# Patient Record
Sex: Male | Born: 1990 | Race: Black or African American | Hispanic: No | Marital: Single | Smoking: Current some day smoker
Health system: Southern US, Community
[De-identification: ages and names within clinical notes are randomized; demographics above are authoritative.]

## PROBLEM LIST (undated history)

## (undated) DIAGNOSIS — J942 Hemothorax: Secondary | ICD-10-CM

## (undated) DIAGNOSIS — S21332A Puncture wound without foreign body of left front wall of thorax with penetration into thoracic cavity, initial encounter: Secondary | ICD-10-CM

## (undated) DIAGNOSIS — W3400XA Accidental discharge from unspecified firearms or gun, initial encounter: Secondary | ICD-10-CM

---

## 2017-10-01 ENCOUNTER — Inpatient Hospital Stay (HOSPITAL_COMMUNITY)
Admission: EM | Admit: 2017-10-01 | Discharge: 2017-10-04 | DRG: 200 | Disposition: A | Discharge status: Home / Self Care | Payer: Self-pay | Attending: Thoracic Surgery (Cardiothoracic Vascular Surgery) | Admitting: Thoracic Surgery (Cardiothoracic Vascular Surgery)

## 2017-10-01 ENCOUNTER — Other Ambulatory Visit: Payer: Self-pay

## 2017-10-01 ENCOUNTER — Encounter (HOSPITAL_COMMUNITY): Payer: Self-pay | Admitting: Emergency Medicine

## 2017-10-01 ENCOUNTER — Inpatient Hospital Stay (HOSPITAL_COMMUNITY): Payer: Self-pay

## 2017-10-01 ENCOUNTER — Emergency Department (HOSPITAL_COMMUNITY): Payer: Self-pay

## 2017-10-01 DIAGNOSIS — W3400XA Accidental discharge from unspecified firearms or gun, initial encounter: Secondary | ICD-10-CM

## 2017-10-01 DIAGNOSIS — S270XXD Traumatic pneumothorax, subsequent encounter: Secondary | ICD-10-CM

## 2017-10-01 DIAGNOSIS — J9311 Primary spontaneous pneumothorax: Secondary | ICD-10-CM

## 2017-10-01 DIAGNOSIS — S21332A Puncture wound without foreign body of left front wall of thorax with penetration into thoracic cavity, initial encounter: Secondary | ICD-10-CM

## 2017-10-01 DIAGNOSIS — Z938 Other artificial opening status: Secondary | ICD-10-CM

## 2017-10-01 DIAGNOSIS — Z9689 Presence of other specified functional implants: Secondary | ICD-10-CM

## 2017-10-01 DIAGNOSIS — J942 Hemothorax: Secondary | ICD-10-CM | POA: Diagnosis present

## 2017-10-01 DIAGNOSIS — J939 Pneumothorax, unspecified: Secondary | ICD-10-CM

## 2017-10-01 DIAGNOSIS — S272XXA Traumatic hemopneumothorax, initial encounter: Principal | ICD-10-CM | POA: Diagnosis present

## 2017-10-01 DIAGNOSIS — F17210 Nicotine dependence, cigarettes, uncomplicated: Secondary | ICD-10-CM | POA: Diagnosis present

## 2017-10-01 DIAGNOSIS — S21142A Puncture wound with foreign body of left front wall of thorax without penetration into thoracic cavity, initial encounter: Secondary | ICD-10-CM | POA: Diagnosis present

## 2017-10-01 DIAGNOSIS — Z4682 Encounter for fitting and adjustment of non-vascular catheter: Secondary | ICD-10-CM

## 2017-10-01 DIAGNOSIS — F1290 Cannabis use, unspecified, uncomplicated: Secondary | ICD-10-CM | POA: Diagnosis present

## 2017-10-01 HISTORY — DX: Hemothorax: J94.2

## 2017-10-01 HISTORY — DX: Accidental discharge from unspecified firearms or gun, initial encounter: W34.00XA

## 2017-10-01 HISTORY — DX: Puncture wound without foreign body of left front wall of thorax with penetration into thoracic cavity, initial encounter: S21.332A

## 2017-10-01 LAB — MRSA PCR SCREENING: MRSA by PCR: NEGATIVE

## 2017-10-01 LAB — GLUCOSE, CAPILLARY
GLUCOSE-CAPILLARY: 145 mg/dL — AB (ref 65–99)
GLUCOSE-CAPILLARY: 110 mg/dL — AB (ref 65–99)

## 2017-10-01 MED ORDER — MORPHINE SULFATE (PF) 4 MG/ML IV SOLN
2.0000 mg | Freq: Once | INTRAVENOUS | Status: AC
  Administered 2017-10-01: 2 mg via INTRAVENOUS
  Filled 2017-10-01: qty 1

## 2017-10-01 MED ORDER — ONDANSETRON HCL 4 MG/2ML IJ SOLN
4.0000 mg | Freq: Four times a day (QID) | INTRAMUSCULAR | Status: DC | PRN
  Administered 2017-10-03: 4 mg via INTRAVENOUS
  Filled 2017-10-01 (×2): qty 2

## 2017-10-01 MED ORDER — ENSURE ENLIVE PO LIQD
237.0000 mL | Freq: Two times a day (BID) | ORAL | Status: DC
  Administered 2017-10-02 – 2017-10-04 (×6): 237 mL via ORAL

## 2017-10-01 MED ORDER — ACETAMINOPHEN 500 MG PO TABS
1000.0000 mg | ORAL_TABLET | Freq: Four times a day (QID) | ORAL | Status: DC
  Administered 2017-10-01 – 2017-10-03 (×6): 1000 mg via ORAL
  Filled 2017-10-01 (×7): qty 2

## 2017-10-01 MED ORDER — SODIUM CHLORIDE 0.9 % IV SOLN
INTRAVENOUS | Status: DC
  Administered 2017-10-01: 17:00:00 via INTRAVENOUS

## 2017-10-01 MED ORDER — TRAMADOL HCL 50 MG PO TABS
50.0000 mg | ORAL_TABLET | Freq: Four times a day (QID) | ORAL | Status: DC | PRN
  Administered 2017-10-03: 100 mg via ORAL
  Filled 2017-10-01: qty 2

## 2017-10-01 MED ORDER — LIDOCAINE HCL (PF) 1 % IJ SOLN
INTRAMUSCULAR | Status: AC
  Filled 2017-10-01: qty 30

## 2017-10-01 MED ORDER — MIDAZOLAM HCL 2 MG/2ML IJ SOLN
2.0000 mg | Freq: Once | INTRAMUSCULAR | Status: AC
  Administered 2017-10-01: 2 mg via INTRAVENOUS
  Filled 2017-10-01: qty 2

## 2017-10-01 MED ORDER — OXYCODONE HCL 5 MG PO TABS
5.0000 mg | ORAL_TABLET | ORAL | Status: DC | PRN
  Administered 2017-10-01 – 2017-10-02 (×2): 10 mg via ORAL
  Administered 2017-10-02: 5 mg via ORAL
  Administered 2017-10-02 – 2017-10-03 (×3): 10 mg via ORAL
  Filled 2017-10-01 (×2): qty 2
  Filled 2017-10-01: qty 1
  Filled 2017-10-01 (×3): qty 2

## 2017-10-01 MED ORDER — SENNOSIDES-DOCUSATE SODIUM 8.6-50 MG PO TABS
1.0000 | ORAL_TABLET | Freq: Every day | ORAL | Status: DC
  Administered 2017-10-02: 1 via ORAL
  Filled 2017-10-01 (×2): qty 1

## 2017-10-01 MED ORDER — POTASSIUM CHLORIDE 10 MEQ/50ML IV SOLN: 10.0000 meq | Freq: Every day | INTRAVENOUS | Status: DC | PRN

## 2017-10-01 MED ORDER — ENOXAPARIN SODIUM 40 MG/0.4ML ~~LOC~~ SOLN: 40.0000 mg | Freq: Every day | SUBCUTANEOUS | Status: DC

## 2017-10-01 MED ORDER — BISACODYL 5 MG PO TBEC
10.0000 mg | DELAYED_RELEASE_TABLET | Freq: Every day | ORAL | Status: DC
  Filled 2017-10-01 (×2): qty 2

## 2017-10-01 MED ORDER — LIDOCAINE-EPINEPHRINE (PF) 2 %-1:200000 IJ SOLN: 10.0000 mL | Freq: Once | INTRAMUSCULAR | Status: DC

## 2017-10-01 MED ORDER — ACETAMINOPHEN 160 MG/5ML PO SOLN: 1000.0000 mg | Freq: Four times a day (QID) | ORAL | Status: DC
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
## 2017-10-01 NOTE — ED Notes (Signed)
Patient transported to X-ray 

## 2017-10-01 NOTE — Op Note (Signed)
CARDIOTHORACIC SURGERY OPERATIVE NOTE  Date of Procedure:  10/01/2017  Preoperative Diagnosis: Left Pneumothorax  Postoperative Diagnosis: Same  Procedure:   Left chest tube placement  Surgeon:   Salvatore Decentlarence H. Cornelius Moraswen, MD  Anesthesia: 1% lidocaine local with intravenous sedation    DETAILS OF THE OPERATIVE PROCEDURE  Following full informed consent the patient was given midazolam 2 mg and morphine sulfate 2 mg intravenously and continuously monitored for rhythm, BP and oxygen saturation. The left chest was prepared and draped in a sterile manner. 1% lidocaine was utilized to anesthetize the skin and subcutaneous tissues. A small incision was made and a 28 French straight chest tube was placed through the incision into the pleural space. The tube was secured to the skin and connected to a closed suction collection device. The patient tolerated the procedure well. A portable CXR was ordered. There were no complications.    Salvatore Decentlarence H. Cornelius Moraswen, MD 10/01/2017 5:39 PM

## 2017-10-02 ENCOUNTER — Inpatient Hospital Stay (HOSPITAL_COMMUNITY): Payer: Self-pay

## 2017-10-02 DIAGNOSIS — Z938 Other artificial opening status: Secondary | ICD-10-CM

## 2017-10-02 LAB — BASIC METABOLIC PANEL
Anion gap: 9 (ref 5–15)
BUN: 18 mg/dL (ref 6–20)
CALCIUM: 8.7 mg/dL — AB (ref 8.9–10.3)
CO2: 23 mmol/L (ref 22–32)
Chloride: 106 mmol/L (ref 101–111)
Creatinine, Ser: 1.02 mg/dL (ref 0.61–1.24)
GFR calc Af Amer: >60 mL/min (ref >60)
GLUCOSE: 89 mg/dL (ref 65–99)
Potassium: 3.5 mmol/L (ref 3.5–5.1)
SODIUM: 138 mmol/L (ref 135–145)

## 2017-10-02 LAB — CBC
HCT: 40.2 % (ref 39.0–52.0)
Hemoglobin: 12.9 g/dL — ABNORMAL LOW (ref 13.0–17.0)
MCH: 27 pg (ref 26.0–34.0)
MCHC: 32.1 g/dL (ref 30.0–36.0)
MCV: 84.1 fL (ref 78.0–100.0)
PLATELETS: 246 10*3/uL (ref 150–400)
RBC: 4.78 MIL/uL (ref 4.22–5.81)
RDW: 14.5 % (ref 11.5–15.5)
WBC: 5.7 10*3/uL (ref 4.0–10.5)

## 2017-10-02 LAB — GLUCOSE, CAPILLARY
GLUCOSE-CAPILLARY: 102 mg/dL — AB (ref 65–99)
GLUCOSE-CAPILLARY: 86 mg/dL (ref 65–99)
Glucose-Capillary: 94 mg/dL (ref 65–99)

## 2017-10-02 NOTE — Progress Notes (Signed)
Pt. educated on pain medications and the importance of only taking the lowest strength that is needed. Pt. also educated on the importance of ambulating several times a day. Pt. states he does not want to ambulate right now because he did not get a lot of sleep last night and just wants to sleep. Pt. states he will be willing to walk around 1500.

## 2017-10-02 NOTE — Progress Notes (Addendum)
      [address]       [place] [id number]             [phone]    Subjective:  No new complaints.  Some pain at chest tube site.    Objective: Vital signs in last 24 hours: Temp:  [97.5 F (36.4 C)-98.6 F (37 C)] 98 F (36.7 C) (02/24 1020) Pulse Rate:  [62-105] 62 (02/24 0530) Cardiac Rhythm: Normal sinus rhythm (02/24 1020) Resp:  [16-25] 18 (02/23 1850) BP: (110-134)/(63-94) 122/67 (02/24 1020) SpO2:  [97 %-100 %] 100 % (02/24 1020) Weight:  [177 lb 14.6 oz (80.7 kg)] 177 lb 14.6 oz (80.7 kg) (02/23 2104)  Intake/Output from previous day: 02/23 0701 - 02/24 0700 In: 480 [P.O.:480] Out: 229 [Urine:175; Chest Tube:54] Intake/Output this shift: No intake/output data recorded.  General appearance: alert, cooperative and no distress Heart: regular rate and rhythm Lungs: clear to auscultation bilaterally Abdomen: soft, non-tender; bowel sounds normal; no masses,  no organomegaly Extremities: extremities normal, atraumatic, no cyanosis or edema Wound: clean and dry  Lab Results: Recent Labs    10/02/17 0152  WBC 5.7  HGB 12.9*  HCT 40.2  PLT 246   BMET:  Recent Labs    10/02/17 0152  NA 138  K 3.5  CL 106  CO2 23  GLUCOSE 89  BUN 18  CREATININE 1.02  CALCIUM 8.7*    PT/INR: No results for input(s): LABPROT, INR in the last 72 hours. ABG No results found for: PHART, HCO3, TCO2, ACIDBASEDEF, O2SAT CBG (last 3)  Recent Labs    10/02/17 0116 10/02/17 0314 10/02/17 0525  GLUCAP 102* 94 86    Assessment/Plan:  1. Chest tube-no air leak, chest tube was supposed to be on suction, but was never connected-- CXR show left apical pneumothorax 2. Pain control- will continue oral use 3. Dispo- patient will be transferred to 4E, will place chest tube to suction as ordered, repeat CXR in AM   LOS: 1 day    Erin Barrett 10/02/2017  I have seen and examined the patient and agree with the assessment and plan as outlined.  Chest tube  was never hooked up to suction.  Will need to transfer patient to a floor where nursing staff is accustomed to caring for patients with chest tubes and executing orders as they are instructed.  Purcell Nailslarence H Ariauna Farabee, MD 10/02/2017 8:45 PM
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
## 2017-10-02 NOTE — Plan of Care (Signed)
Pt and visitor were updated and educated on pts plan of care. Pt expressed frustration over not being told earlier in the day what he needed to do promote healing. Writer educated pt the importance of staying active throughout the day, keeping HOB at 45 degrees, dangling legs on the side of the bed 2-3 times a day, ambulating 2-3 times a day. Pt states he did not feel like walking much today because he did not sleep well last night. Writer explained that sleep is also important, and provided encouragement so that he is now aware of what his plan of care is and how the rest of the shift will look tonight and what he needs to do to prepare for discharge.

## 2017-10-03 ENCOUNTER — Inpatient Hospital Stay (HOSPITAL_COMMUNITY): Payer: Self-pay

## 2017-10-03 MED ORDER — ACETAMINOPHEN 160 MG/5ML PO SOLN: 1000.0000 mg | Freq: Four times a day (QID) | ORAL | Status: DC

## 2017-10-03 MED ORDER — ACETAMINOPHEN 500 MG PO TABS
1000.0000 mg | ORAL_TABLET | Freq: Four times a day (QID) | ORAL | Status: DC
  Administered 2017-10-03 – 2017-10-04 (×4): 1000 mg via ORAL
  Filled 2017-10-03 (×3): qty 2

## 2017-10-03 NOTE — Progress Notes (Signed)
Patient arrived on 4E on a wheelchair, assessment completed see flowsheet, placed on tele ccmd notified, patient oriented to room and staff, bed in lowest position, call bell in reach will continue to monitor.

## 2017-10-03 NOTE — Progress Notes (Addendum)
      [address]       [place] [id number]             [phone]           Subjective: Patient sitting in chair. Has pain at chest tube site.  Objective: Vital signs in last 24 hours: Temp:  [98.2 F (36.8 C)-98.5 F (36.9 C)] 98.5 F (36.9 C) (02/25 1000) Pulse Rate:  [69-75] 69 (02/25 0629) Cardiac Rhythm: Normal sinus rhythm (02/25 0800) Resp:  [18] 18 (02/25 0629) BP: (118-124)/(67-72) 124/72 (02/25 0629) SpO2:  [100 %] 100 % (02/25 0629)      Intake/Output from previous day: 02/24 0701 - 02/25 0700 In: -  Out: 1150 [Urine:975; Chest Tube:175]   Physical Exam:  Cardiovascular: RRR Pulmonary: Clear to auscultation bilaterally Extremities: No lower extremity edema. Wounds: Dressing is clean and dry.   Chest Tube: to water seal, no air leak  Lab Results: CBC: Recent Labs    10/02/17 0152  WBC 5.7  HGB 12.9*  HCT 40.2  PLT 246   BMET:  Recent Labs    10/02/17 0152  NA 138  K 3.5  CL 106  CO2 23  GLUCOSE 89  BUN 18  CREATININE 1.02  CALCIUM 8.7*    PT/INR: No results for input(s): LABPROT, INR in the last 72 hours. ABG:  INR: Will add last result for INR, ABG once components are confirmed Will add last 4 CBG results once components are confirmed  Assessment/Plan:  1. CV - SR. 2.  Pulmonary - On room air. Chest tube with 175 cc of output last 24 hours. Chest tube is to water seal. There is no air leak. CXR this am is stable (showed small left pneumothorax and small effusion). Encourage incentive spirometer. Hope to remove chest tube in am. Check CXR in am.   Lelon HuhDonielle M Peak One Surgery CenterZimmermanPA-C 10/03/2017,11:03 AM   I have seen and examined the patient and agree with the assessment and plan as outlined.  D/C tube tomorrow if CXR stable and no air leak.   I spent in excess of 15 minutes during the conduct of this hospital encounter and >50% of this time involved direct face-to-face encounter with the patient for counseling and/or  coordination of their care.   Purcell Nailslarence H Arleen Bar, MD 10/03/2017

## 2017-10-03 NOTE — Progress Notes (Signed)
Arrival of shift, pt complained of pain 7/10. Patient requested for oxycodone instead of tramadol. About 10:30 pm, pt had vomited twice. Pt was assessed. VSS. Gf was at the bedside getting upset and accusing of "too many pills being forced into pt". Pt was explained PRN zofran for nausea. Pt requested zofran and is resting. Will continue to monitor.   Judithann SheenJuhi Antonae Zbikowski., RN

## 2017-10-03 NOTE — Progress Notes (Signed)
Patient ambulated [id number] in the hall. Ambulation well tolerated.

## 2017-10-03 NOTE — Progress Notes (Signed)
Patient refused to ambulate in the hall, he stated that he would walk later, patient educated on the need to ambulate he verbalised understanding, will continue to monitor and report to oncoming nurse.

## 2017-10-03 NOTE — Care Management Note (Signed)
Case Management Note  Patient Details  Name: Stanley Stevenson MRN: [id number] Date of Birth: 08-18-1990  Subjective/Objective:   From home, pta indep, he presents with ptx, has chest tube in today, Patient has follow up apt at [facility] for 3/19 at 1:30.   He also can utilize the [facility] for medication assit if dc Mon thru Friday.  If dc over the weekend he will need assist with Match letter for medications. Also financial counselor will see patient about medicaid.                 Action/Plan: NCM will follow for transition of care.  Expected Discharge Date:                  Expected Discharge Plan:  Home/Self Care  In-House Referral:     Discharge planning Services  CM Consult, Medication Assistance, Follow-up appt scheduled, [facility]  Post Acute Care Choice:    Choice offered to:     DME Arranged:    DME Agency:     HH Arranged:    HH Agency:     Status of Service:  In process, will continue to follow  If discussed at Long Length of Stay Meetings, dates discussed:    Additional Comments:  Stanley Stevenson, Stanley Stevenson [place], RN 10/03/2017, 11:09 AM

## 2017-10-03 NOTE — Progress Notes (Signed)
Nutrition Brief Note  Patient identified on the Malnutrition Screening Tool (MST) Report.  Wt Readings from Last 15 Encounters:  10/01/17 177 lb 14.6 oz (80.7 kg)   Body mass index is 25.53 kg/m. Patient meets criteria for Overweight based on current BMI.   Current diet order is Regular. Reports an excellent appetite. Labs and medications reviewed.   Nutrition focused physical exam completed.  No muscle or subcutaneous fat depletion noticed.  No nutrition interventions warranted at this time. If nutrition issues arise, please consult RD.   Maureen ChattersKatie Justun Anaya, RD, LDN Pager #: [phone] After-Hours Pager #: [phone]

## 2017-10-04 ENCOUNTER — Inpatient Hospital Stay (HOSPITAL_COMMUNITY): Payer: Self-pay

## 2017-10-04 MED ORDER — IBUPROFEN 200 MG PO TABS: 200.0000 mg | ORAL_TABLET | Freq: Four times a day (QID) | ORAL | Status: AC | PRN

## 2017-10-04 NOTE — Progress Notes (Signed)
Chest tube L anterior chest removed w/o difficulty per order.  Scant clear/red drainage noted at site.  Dry dressing applied.  Nurses x 2 present. Eugenio HoesMichelle Mikah Poss RN

## 2017-10-04 NOTE — Progress Notes (Signed)
Patient in a stable condition, discharge eduction reviewed with patient and his girlfriend at bedside, they verbalized understanding, tele dc ccmd notified, patient belongings at bedside, patient awaiting his family to transport him home.

## 2017-10-04 NOTE — Progress Notes (Signed)
      [address]       [place] [id number]             [phone]      Subjective:  Mr. Stanley Stevenson has no complaints.  He wants to go home today.  Objective: Vital signs in last 24 hours: Temp:  [98.1 F (36.7 C)-98.7 F (37.1 C)] 98.5 F (36.9 C) (02/26 0730) Pulse Rate:  [67-97] 89 (02/26 0730) Cardiac Rhythm: Sinus tachycardia (02/26 0700) Resp:  [11-31] 23 (02/26 0730) BP: (127-139)/(74-86) 127/77 (02/26 0730) SpO2:  [98 %-100 %] 100 % (02/26 0730)  Intake/Output from previous day: 02/25 0701 - 02/26 0700 In: 315 [P.O.:240] Out: 50 [Chest Tube:50]  General appearance: alert, cooperative and no distress Heart: regular rate and rhythm Lungs: clear to auscultation bilaterally Abdomen: soft, non-tender; bowel sounds normal; no masses,  no organomegaly Extremities: extremities normal, atraumatic, no cyanosis or edema Wound: clean and dry  Lab Results: Recent Labs    10/02/17 0152  WBC 5.7  HGB 12.9*  HCT 40.2  PLT 246   BMET:  Recent Labs    10/02/17 0152  NA 138  K 3.5  CL 106  CO2 23  GLUCOSE 89  BUN 18  CREATININE 1.02  CALCIUM 8.7*    PT/INR: No results for input(s): LABPROT, INR in the last 72 hours. ABG No results found for: PHART, HCO3, TCO2, ACIDBASEDEF, O2SAT CBG (last 3)  Recent Labs    10/02/17 0116 10/02/17 0314 10/02/17 0525  GLUCAP 102* 94 86    Assessment/Plan:  1. Chest tube- no air leak, CXR stable appearance of pneumothorax- will d/c chest tubes today 2. CV- hemodynamically stable in NSR 3. Dispo- patient stable, d/c chest tube, repeat CXR this afternoon if stable, will d/c home later today   LOS: 3 days    Stanley Stevenson 10/04/2017

## 2017-10-04 NOTE — Discharge Instructions (Signed)
Discharge Instructions:  1. You may shower, wash chest tube sites daily with soap and water.  Sutures will remain in place, if drainage is present you may cover with clean, dry dressing 2. No Driving while taking narcotic pain medications 3. Activity- up as tolerated, no strenuous activity or lifting for 4 weeks 4. Diet Regular 5. Please contact office if questions or concerns arise   Pneumothorax A pneumothorax, commonly called a collapsed lung, is a condition in which air leaks from a lung and builds up in the space between the lung and the chest wall (pleural space). The air in a pneumothorax is trapped outside the lung and takes up space, preventing the lung from fully expanding. This is a condition that usually occurs suddenly. The buildup of air may be small or large. A small pneumothorax may go away on its own. When a pneumothorax is larger, it will often require medical treatment and hospitalization. What are the causes? A pneumothorax can sometimes happen quickly with no apparent cause. People with underlying lung problems, particularly COPD or emphysema, are at higher risk of pneumothorax. However, pneumothorax can happen quickly even in people with no prior known lung problems. Trauma, surgery, medical procedures, or injury to the chest wall can also cause a pneumothorax. What are the signs or symptoms? Sometimes a pneumothorax will have no symptoms. When symptoms are present, they can include:  Chest pain.  Shortness of breath.  Increased rate of breathing.  Bluish color to your lips or skin (cyanosis).  How is this diagnosed? Pneumothorax is usually diagnosed by a chest X-ray or chest CT scan. Your health care provider will also take a medical history and perform a physical exam to determine why you may have a pneumothorax. How is this treated? A small pneumothorax may go away on its own without treatment. Extra oxygen can sometimes help a small pneumothorax go away more  quickly. For a larger pneumothorax or a pneumothorax that is causing symptoms, a procedure is usually needed to drain the air.In some cases, the health care provider may drain the air using a needle. In other cases, a chest tube may be inserted into the pleural space. A chest tube is a small tube placed between the ribs and into the pleural space. This removes the extra air and allows the lung to expand back to its normal size. A large pneumothorax will usually require a hospital stay. If there is ongoing air leakage into the pleural space, then the chest tube may need to remain in place for several days until the air leak has healed. In some cases, surgery may be needed. Follow these instructions at home:  Only take over-the-counter or prescription medicines as directed by your health care provider.  If a cough or pain makes it difficult for you to sleep at night, try sleeping in a semi-upright position in a recliner or by using 2 or 3 pillows.  Rest and limit activity as directed by your health care provider.  If you had a chest tube and it was removed, ask your health care provider when it is okay to remove the dressing. Until your health care provider says you can remove the dressing, do not allow it to get wet.  Do not smoke. Smoking is a risk factor for pneumothorax.  Do not fly in an airplane or scuba dive until your health care provider says it is okay.  Follow up with your health care provider as directed. Get help right away if:  You have increasing chest pain or shortness of breath.  You have a cough that is not controlled with suppressants.  You begin coughing up blood.  You have pain that is getting worse or is not controlled with medicines.  You cough up thick, discolored mucus (sputum) that is yellow to Klimaszewski in color.  You have redness, increasing pain, or discharge at the site where a chest tube had been in place (if your pneumothorax was treated with a chest  tube).  The site where your chest tube was located opens up.  You feel air coming out of the site where the chest tube was placed.  You have a fever or persistent symptoms for more than 2-3 days.  You have a fever and your symptoms suddenly get worse. This information is not intended to replace advice given to you by your health care provider. Make sure you discuss any questions you have with your health care provider. Document Released: 07/26/2005 Document Revised: 01/01/2016 Document Reviewed: 12/19/2013 Elsevier Interactive Patient Education  [employer]2018 [employer]

## 2017-10-24 ENCOUNTER — Other Ambulatory Visit: Payer: Self-pay | Admitting: Thoracic Surgery (Cardiothoracic Vascular Surgery)

## 2017-10-24 ENCOUNTER — Ambulatory Visit: Payer: Self-pay

## 2017-10-24 DIAGNOSIS — J942 Hemothorax: Secondary | ICD-10-CM

## 2017-10-25 ENCOUNTER — Ambulatory Visit (INDEPENDENT_AMBULATORY_CARE_PROVIDER_SITE_OTHER): Payer: Self-pay | Admitting: Physician Assistant

## 2017-11-07 ENCOUNTER — Inpatient Hospital Stay (INDEPENDENT_AMBULATORY_CARE_PROVIDER_SITE_OTHER): Payer: Self-pay | Admitting: Physician Assistant

## 2017-11-07 ENCOUNTER — Ambulatory Visit: Payer: Self-pay
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
